# Patient Record
Sex: Male | Born: 1975 | Race: White | Hispanic: No | Marital: Married | Smoking: Never smoker
Health system: Southern US, Community
[De-identification: ages and names within clinical notes are randomized; demographics above are authoritative.]

## PROBLEM LIST (undated history)

## (undated) DIAGNOSIS — E079 Disorder of thyroid, unspecified: Secondary | ICD-10-CM

## (undated) DIAGNOSIS — I10 Essential (primary) hypertension: Secondary | ICD-10-CM

## (undated) DIAGNOSIS — E7800 Pure hypercholesterolemia, unspecified: Secondary | ICD-10-CM

## (undated) HISTORY — PX: OTHER SURGICAL HISTORY: SHX169

---

## 2004-04-22 ENCOUNTER — Ambulatory Visit: Payer: Self-pay | Admitting: Internal Medicine

## 2004-08-25 ENCOUNTER — Ambulatory Visit: Payer: Self-pay | Admitting: Internal Medicine

## 2004-09-07 ENCOUNTER — Ambulatory Visit: Payer: Self-pay | Admitting: Internal Medicine

## 2004-12-07 ENCOUNTER — Ambulatory Visit: Payer: Self-pay | Admitting: Internal Medicine

## 2011-07-17 ENCOUNTER — Encounter (HOSPITAL_BASED_OUTPATIENT_CLINIC_OR_DEPARTMENT_OTHER): Payer: Self-pay | Admitting: *Deleted

## 2011-07-17 ENCOUNTER — Emergency Department (HOSPITAL_BASED_OUTPATIENT_CLINIC_OR_DEPARTMENT_OTHER)
Admission: EM | Admit: 2011-07-17 | Discharge: 2011-07-17 | Disposition: A | Discharge status: Home / Self Care | Payer: Commercial Managed Care - PPO | Attending: Emergency Medicine | Admitting: Emergency Medicine

## 2011-07-17 DIAGNOSIS — I951 Orthostatic hypotension: Secondary | ICD-10-CM | POA: Insufficient documentation

## 2011-07-17 DIAGNOSIS — Z79899 Other long term (current) drug therapy: Secondary | ICD-10-CM | POA: Insufficient documentation

## 2011-07-17 DIAGNOSIS — E7800 Pure hypercholesterolemia, unspecified: Secondary | ICD-10-CM | POA: Insufficient documentation

## 2011-07-17 DIAGNOSIS — J069 Acute upper respiratory infection, unspecified: Secondary | ICD-10-CM | POA: Insufficient documentation

## 2011-07-17 DIAGNOSIS — E079 Disorder of thyroid, unspecified: Secondary | ICD-10-CM | POA: Insufficient documentation

## 2011-07-17 DIAGNOSIS — J45909 Unspecified asthma, uncomplicated: Secondary | ICD-10-CM | POA: Insufficient documentation

## 2011-07-17 HISTORY — DX: Pure hypercholesterolemia, unspecified: E78.00

## 2011-07-17 HISTORY — DX: Disorder of thyroid, unspecified: E07.9

## 2011-07-17 MED ORDER — OXYMETAZOLINE HCL 0.05 % NA SOLN
1.0000 | Freq: Once | NASAL | Status: AC
  Administered 2011-07-17: 1 via NASAL
  Filled 2011-07-17: qty 15

## 2011-07-17 NOTE — ED Provider Notes (Signed)
History     CSN: [id number]  Arrival date & time 07/17/11  5621   First MD Initiated Contact with Patient 07/17/11 [phone]      Chief Complaint  Patient presents with  . URI    (Consider location/radiation/quality/duration/timing/severity/associated sxs/prior treatment) Patient is a 36 y.o. male presenting with URI. The history is provided by the patient.  URI The primary symptoms include ear pain, cough and nausea. Primary symptoms do not include fever, headaches, sore throat or vomiting. The current episode started 6 to 7 days ago. This is a new problem. The problem has not changed since onset. The ear pain began more than 2 days ago. Ear pain is a new problem. Progression since onset: Popping and mild pain in bilateral ears. Both ears are affected. The pain is mild.    The cough began 6 to 7 days ago. The cough is non-productive and dry.  The onset of the illness is associated with exposure to sick contacts (The entire family is sick). Symptoms associated with the illness include plugged ear sensation, congestion and rhinorrhea. The illness is not associated with facial pain. Associated symptoms comments: When he stood up this morning he felt lightheaded and near syncopal which improved when he went outside. The following treatments were addressed: A decongestant was ineffective. NSAIDs were effective.    Past Medical History  Diagnosis Date  . Asthma   . Thyroid disease   . High cholesterol     Past Surgical History  Procedure Date  . Other surgical history     fistula    No family history on file.  History  Substance Use Topics  . Smoking status: Never Smoker   . Smokeless tobacco: Not on file  . Alcohol Use: Yes      Review of Systems  Constitutional: Negative for fever.  HENT: Positive for ear pain, congestion and rhinorrhea. Negative for sore throat.   Respiratory: Positive for cough.   Gastrointestinal: Positive for nausea. Negative for vomiting.    Neurological: Positive for light-headedness. Negative for syncope, weakness and headaches.  All other systems reviewed and are negative.    Allergies  Review of patient's allergies indicates no known allergies.  Home Medications   Current Outpatient Rx  Name Route Sig Dispense Refill  . SYNTHROID PO Oral Take by mouth.    Marland Kitchen NIASPAN PO Oral Take by mouth.      BP 137/76  Pulse 77  Temp(Src) 97.5 F (36.4 C) (Oral)  Resp 19  SpO2 99%  Physical Exam  Nursing note and vitals reviewed. Constitutional: He is oriented to person, place, and time. He appears well-developed and well-nourished. No distress.  HENT:  Head: Normocephalic and atraumatic.  Right Ear: Ear canal normal.  Left Ear: Ear canal normal.  Nose: Mucosal edema and rhinorrhea present. No sinus tenderness. Right sinus exhibits no maxillary sinus tenderness and no frontal sinus tenderness. Left sinus exhibits no maxillary sinus tenderness and no frontal sinus tenderness.  Mouth/Throat: Oropharynx is clear and moist.       Sterile fluid behind bilateral TMs that are bulging  Eyes: Conjunctivae and EOM are normal. Pupils are equal, round, and reactive to light.  Neck: Normal range of motion. Neck supple.  Cardiovascular: Normal rate, regular rhythm and intact distal pulses.   No murmur heard. Pulmonary/Chest: Effort normal and breath sounds normal. No respiratory distress. He has no wheezes. He has no rales.  Musculoskeletal: Normal range of motion. He exhibits no edema and no tenderness.  Neurological: He is alert and oriented to person, place, and time.  Skin: Skin is warm and dry. No rash noted. No erythema.  Psychiatric: He has a normal mood and affect. His behavior is normal.    ED Course  Procedures (including critical care time)  Labs Reviewed - No data to display No results found.   1. URI (upper respiratory infection)   2. Orthostatic hypotension       MDM   Pt with symptoms consistent with  viral URI that has been ongoing for the last 1 week and states today when he stood up from the couch he felt lightheaded and dizzy. There was no syncope he denies any shortness of breath or pain. Because he has a strong family history of cardiac disease and he was looking on the Internet some of the information he found concerned him and he wanted to be checked out.  Well appearing here.  No signs of breathing difficulty  No signs of pharyngitis, otitis or abnormal abdominal findings.  Heart and lung exam is normal. There is no wheezing but he does have nasal congestion with swollen nasal turbinates and bulging TMs bilaterally. Due to no chest pain, shortness of breath or syncope do not feel that an EKG or chest x-ray is warranted at this time. The patient was a little orthostatic this morning after standing up and that's what caused his symptoms. He has been taking Mucinex and not drinking much fluid and feel he is just mildly dehydrated. He will increase his fluid intake and rest for the next few days and use Afrin as needed for congestion.        Gwyneth Sprout, MD 07/17/11 [phone]

## 2011-07-17 NOTE — ED Notes (Signed)
Patient states he has been having cold symptoms all week, cough, sinus congestion, yellow colored drainage, this morning concerned because of feeling dizzy/light-headed/nausea, slept on the couch last night sitting up to assistt him in breathing due to congestion, no chills/vomiting. Per wife concerned due to family Hx of cardiac issues

## 2019-06-15 ENCOUNTER — Other Ambulatory Visit: Payer: Self-pay

## 2019-06-15 ENCOUNTER — Encounter (HOSPITAL_BASED_OUTPATIENT_CLINIC_OR_DEPARTMENT_OTHER): Payer: Self-pay

## 2019-06-15 ENCOUNTER — Emergency Department (HOSPITAL_BASED_OUTPATIENT_CLINIC_OR_DEPARTMENT_OTHER)
Admission: EM | Admit: 2019-06-15 | Discharge: 2019-06-15 | Disposition: A | Discharge status: Home / Self Care | Payer: Managed Care, Other (non HMO) | Attending: Emergency Medicine | Admitting: Emergency Medicine

## 2019-06-15 DIAGNOSIS — R42 Dizziness and giddiness: Secondary | ICD-10-CM

## 2019-06-15 DIAGNOSIS — J45909 Unspecified asthma, uncomplicated: Secondary | ICD-10-CM | POA: Insufficient documentation

## 2019-06-15 DIAGNOSIS — I10 Essential (primary) hypertension: Secondary | ICD-10-CM | POA: Diagnosis not present

## 2019-06-15 HISTORY — DX: Essential (primary) hypertension: I10

## 2019-06-15 MED ORDER — LORAZEPAM 2 MG/ML IJ SOLN
1.0000 mg | Freq: Once | INTRAMUSCULAR | Status: AC
  Administered 2019-06-15: 1 mg via INTRAVENOUS
  Filled 2019-06-15: qty 1

## 2019-06-15 MED ORDER — MECLIZINE HCL 25 MG PO TABS: 25.0000 mg | ORAL_TABLET | Freq: Once | ORAL | Status: DC

## 2019-06-15 MED ORDER — MECLIZINE HCL 25 MG PO TABS
25.0000 mg | ORAL_TABLET | Freq: Once | ORAL | Status: AC
  Administered 2019-06-15: 01:00:00 25 mg via ORAL
  Filled 2019-06-15: qty 1

## 2019-06-15 MED ORDER — ONDANSETRON HCL 4 MG/2ML IJ SOLN: 4.0000 mg | Freq: Once | INTRAMUSCULAR | Status: AC | PRN

## 2019-06-15 MED ORDER — ONDANSETRON HCL 4 MG/2ML IJ SOLN
INTRAMUSCULAR | Status: AC
  Administered 2019-06-15: 01:00:00 4 mg via INTRAVENOUS
  Filled 2019-06-15: qty 2

## 2019-06-15 MED ORDER — SODIUM CHLORIDE 0.9 % IV BOLUS
1000.0000 mL | Freq: Once | INTRAVENOUS | Status: AC
  Administered 2019-06-15: 01:00:00 1000 mL via INTRAVENOUS

## 2019-06-15 MED ORDER — MECLIZINE HCL 25 MG PO TABS: 25.0000 mg | ORAL_TABLET | Freq: Three times a day (TID) | ORAL | 0 refills | Status: AC | PRN

## 2019-06-15 NOTE — ED Notes (Signed)
Pt wife updated at this time with pt plan of care.

## 2019-06-15 NOTE — ED Triage Notes (Signed)
Hx of vertigo, started vomiting 1 hr ago, ear pressure x 2 days and dizziness. Vomited x2 with EMS, actively vomiting during triage.

## 2019-06-15 NOTE — ED Provider Notes (Signed)
[facility] EMERGENCY DEPARTMENT Provider Note   CSN: [id number] Arrival date & time: 06/15/19  0031     History Chief Complaint  Patient presents with  . Dizziness    Joel Nguyen is a 43 y.o. male.  Patient is a 43 year old male with past medical history of hypertension, asthma, and recurrent vertigo.  Patient presents this evening with dizziness and vomiting.  This started acutely and was preceded by a "fullness" in his left ear.  This is how his episodes seem to begin.  He denies any injury or trauma.  He began vomiting uncontrollably and got to the point where he could not move without severe dizziness and vomiting.  EMS was called and he was transported here.  The history is provided by the patient.  Dizziness Quality:  Head spinning Severity:  Severe Onset quality:  Sudden Duration:  1 hour Timing:  Constant Progression:  Unchanged Chronicity:  Recurrent Context: head movement   Relieved by:  Nothing Worsened by:  Nothing Ineffective treatments:  None tried      Past Medical History:  Diagnosis Date  . Asthma   . High cholesterol   . Hypertension   . Thyroid disease     There are no problems to display for this patient.   Past Surgical History:  Procedure Laterality Date  . OTHER SURGICAL HISTORY     fistula       History reviewed. No pertinent family history.  Social History   Tobacco Use  . Smoking status: Never Smoker  . Smokeless tobacco: Never Used  Substance Use Topics  . Alcohol use: Yes  . Drug use: No    Home Medications Prior to Admission medications   Medication Sig Start Date End Date Taking? Authorizing Provider  Levothyroxine Sodium (SYNTHROID PO) Take by mouth.    [provider]  Niacin, Antihyperlipidemic, (NIASPAN PO) Take by mouth.    [provider]    Allergies    Vicodin [hydrocodone-acetaminophen]  Review of Systems   Review of Systems  Neurological: Positive for dizziness.  All  other systems reviewed and are negative.   Physical Exam Updated Vital Signs Ht 6' (1.829 m)   Wt 99.8 kg   BMI 29.84 kg/m   Physical Exam Vitals and nursing note reviewed.  Constitutional:      General: He is not in acute distress.    Appearance: He is well-developed. He is not diaphoretic.  HENT:     Head: Normocephalic and atraumatic.     Right Ear: Tympanic membrane normal.     Left Ear: Tympanic membrane normal.  Cardiovascular:     Rate and Rhythm: Normal rate and regular rhythm.     Heart sounds: No murmur. No friction rub.  Pulmonary:     Effort: Pulmonary effort is normal. No respiratory distress.     Breath sounds: Normal breath sounds. No wheezing or rales.  Abdominal:     General: Bowel sounds are normal. There is no distension.     Palpations: Abdomen is soft.     Tenderness: There is no abdominal tenderness.  Musculoskeletal:        General: Normal range of motion.     Cervical back: Normal range of motion and neck supple.  Skin:    General: Skin is warm and dry.  Neurological:     Mental Status: He is alert and oriented to person, place, and time.     Cranial Nerves: No cranial nerve deficit.  Motor: No weakness.     Coordination: Coordination normal.     Comments: Positive Hallpike.     ED Results / Procedures / Treatments   Labs (all labs ordered are listed, but only abnormal results are displayed) Labs Reviewed - No data to display  EKG None  Radiology No results found.  Procedures Procedures (including critical care time)  Medications Ordered in ED Medications  meclizine (ANTIVERT) tablet 25 mg (has no administration in time range)  sodium chloride 0.9 % bolus 1,000 mL (has no administration in time range)  ondansetron (ZOFRAN) injection 4 mg (4 mg Intravenous Given 06/15/19 0040)    ED Course  I have reviewed the triage vital signs and the nursing notes.  Pertinent labs & imaging results that were available during my care of  the patient were reviewed by me and considered in my medical decision making (see chart for details).    MDM Rules/Calculators/A&P  Patient is a 43 year old male presenting with complaints of fullness in his left ear followed by dizziness, nausea, and vomiting. Patient has history of vertigo and this feels similar. Patient was given Zofran by EMS, then an additional dose here along with IV fluids, meclizine, and Ativan. He is feeling significantly improved and believes he can go home.  As patient has been experiencing these episodes for the past 2 years and this is consistent with prior episodes, I do not feel the need for extensive work-up. He is to follow-up with an ENT.  Final Clinical Impression(s) / ED Diagnoses Final diagnoses:  None    Rx / DC Orders ED Discharge Orders    None       Geoffery Lyons, MD 06/15/19 [phone]

## 2019-06-15 NOTE — ED Notes (Signed)
Pt ambulated in hallway- reports feeling dizziness and nausea. Pt assisted back to bed. edp informed.

## 2019-06-15 NOTE — ED Notes (Signed)
Pt reports feeling better and wants to go home

## 2019-06-15 NOTE — Discharge Instructions (Signed)
Begin taking meclizine as prescribed.  Follow-up with ENT if symptoms not improving in the next few days, and return to the ER if symptoms significantly worsen or change.

## 2020-03-25 ENCOUNTER — Encounter (HOSPITAL_BASED_OUTPATIENT_CLINIC_OR_DEPARTMENT_OTHER): Payer: Self-pay | Admitting: Emergency Medicine

## 2020-03-25 ENCOUNTER — Emergency Department (HOSPITAL_BASED_OUTPATIENT_CLINIC_OR_DEPARTMENT_OTHER): Payer: BC Managed Care – PPO

## 2020-03-25 ENCOUNTER — Other Ambulatory Visit: Payer: Self-pay

## 2020-03-25 ENCOUNTER — Emergency Department (HOSPITAL_BASED_OUTPATIENT_CLINIC_OR_DEPARTMENT_OTHER)
Admission: EM | Admit: 2020-03-25 | Discharge: 2020-03-25 | Disposition: A | Discharge status: Home / Self Care | Payer: BC Managed Care – PPO | Attending: Emergency Medicine | Admitting: Emergency Medicine

## 2020-03-25 DIAGNOSIS — J45909 Unspecified asthma, uncomplicated: Secondary | ICD-10-CM | POA: Insufficient documentation

## 2020-03-25 DIAGNOSIS — R0602 Shortness of breath: Secondary | ICD-10-CM | POA: Diagnosis present

## 2020-03-25 DIAGNOSIS — E039 Hypothyroidism, unspecified: Secondary | ICD-10-CM | POA: Diagnosis not present

## 2020-03-25 DIAGNOSIS — U071 COVID-19: Secondary | ICD-10-CM

## 2020-03-25 DIAGNOSIS — I10 Essential (primary) hypertension: Secondary | ICD-10-CM | POA: Diagnosis not present

## 2020-03-25 LAB — CBC WITH DIFFERENTIAL/PLATELET
Abs Immature Granulocytes: 0.05 10*3/uL (ref 0.00–0.07)
Basophils Absolute: 0 10*3/uL (ref 0.0–0.1)
Basophils Relative: 0 %
Eosinophils Absolute: 0 10*3/uL (ref 0.0–0.5)
Eosinophils Relative: 0 %
HCT: 45.4 % (ref 39.0–52.0)
Hemoglobin: 14.6 g/dL (ref 13.0–17.0)
Immature Granulocytes: 1 %
Lymphocytes Relative: 17 %
Lymphs Abs: 1.6 10*3/uL (ref 0.7–4.0)
MCH: 27.2 pg (ref 26.0–34.0)
MCHC: 32.2 g/dL (ref 30.0–36.0)
MCV: 84.7 fL (ref 80.0–100.0)
Monocytes Absolute: 0.8 10*3/uL (ref 0.1–1.0)
Monocytes Relative: 8 %
Neutro Abs: 7.2 10*3/uL (ref 1.7–7.7)
Neutrophils Relative %: 74 %
Platelets: 370 10*3/uL (ref 150–400)
RBC: 5.36 MIL/uL (ref 4.22–5.81)
RDW: 13.3 % (ref 11.5–15.5)
WBC: 9.6 10*3/uL (ref 4.0–10.5)
nRBC: 0 % (ref 0.0–0.2)

## 2020-03-25 LAB — BASIC METABOLIC PANEL
Anion gap: 12 (ref 5–15)
BUN: 24 mg/dL — ABNORMAL HIGH (ref 6–20)
CO2: 27 mmol/L (ref 22–32)
Calcium: 8.8 mg/dL — ABNORMAL LOW (ref 8.9–10.3)
Chloride: 99 mmol/L (ref 98–111)
Creatinine, Ser: 0.95 mg/dL (ref 0.61–1.24)
GFR calc non Af Amer: >60 mL/min (ref >60)
Glucose, Bld: 112 mg/dL — ABNORMAL HIGH (ref 70–99)
Potassium: 3.5 mmol/L (ref 3.5–5.1)
Sodium: 138 mmol/L (ref 135–145)

## 2020-03-25 LAB — D-DIMER, QUANTITATIVE: D-Dimer, Quant: 0.81 ug/mL-FEU — ABNORMAL HIGH (ref 0.00–0.50)

## 2020-03-25 LAB — TROPONIN I (HIGH SENSITIVITY)
Troponin I (High Sensitivity): 9 ng/L (ref <18)
Troponin I (High Sensitivity): 9 ng/L (ref <18)

## 2020-03-25 MED ORDER — IOHEXOL 350 MG/ML SOLN
100.0000 mL | Freq: Once | INTRAVENOUS | Status: AC | PRN
  Administered 2020-03-25: 100 mL via INTRAVENOUS

## 2020-03-25 NOTE — Discharge Instructions (Addendum)
You have COVID-19.  Your CT scan does not show signs of a PE.  It does show you have Covid.  There is also a 3 cm hypodense right thyroid nodule recommend continue following up with your PCP.    Since you are Covid positive you must self quarantine for 10 days since symptom onset.  I recommend over-the-counter pain medication like ibuprofen or Tylenol as needed for pain, please follow dosing on the back of bottle.  Please stay hydrated and if you do not have an appetite recommend soups as this will provide you with fluid as well as calories.  If you have additional questions regarding Covid please call post Covid care for further recommendations and management of your Covid symptoms.  Come back to emergency department if develop severe chest pain, shortness of breath, severe abdominal pain, uncontrolled nausea, vomiting, diarrhea.

## 2020-03-25 NOTE — ED Triage Notes (Signed)
Pt here for increased SOB today, he tested positive on PCR on Tuesday. Pt had video visit today for increase SOB and sent here today to rule out blood clot and is requesting CT per PCP. Pt in no distress in triage, o2 95% on room air.

## 2020-03-25 NOTE — ED Provider Notes (Signed)
[facility] EMERGENCY DEPARTMENT Provider Note   CSN: [id number] Arrival date & time: 03/25/20  1317     History Chief Complaint  Patient presents with  . Cough  . Shortness of Breath    Joel Nguyen is a 44 y.o. male.  HPI    Patient with significant medical history of asthma, hypertension presents to the emergency department with chief complaint of increased shortness of breath after being diagnosed with Covid on 09/29 started on prednisone and Zithromax.  Patient saw his PCP today, he noted that the patient was having difficulty breathing and encouraged him to come to emergency department for additional work-up as well as a PE rule out.  Patient states his symptoms started on Tuesday and he had productive cough with general body aches as well as decrease in appetite.  He denies nasal congestion, ear pain, chest pain, abdominal pain, nausea, vomiting, diarrhea.  He states he is not currently Covid vaccinated but is scheduled to go to his mono body infusion tomorrow.  He came here today just to be assessed to make sure he does not have a PE.  Patient denies headache, fever, chills, sore throat, chest pain, abdominal pain, nausea vomiting diarrhea, pedal edema.  Past Medical History:  Diagnosis Date  . Asthma   . High cholesterol   . Hypertension   . Thyroid disease     There are no problems to display for this patient.   Past Surgical History:  Procedure Laterality Date  . OTHER SURGICAL HISTORY     fistula       No family history on file.  Social History   Tobacco Use  . Smoking status: Never Smoker  . Smokeless tobacco: Never Used  Substance Use Topics  . Alcohol use: Yes    Comment: occasionally  . Drug use: No    Home Medications Prior to Admission medications   Medication Sig Start Date End Date Taking? Authorizing Provider  Levothyroxine Sodium (SYNTHROID PO) Take by mouth.    [provider]  meclizine (ANTIVERT) 25 MG tablet Take 1  tablet (25 mg total) by mouth 3 (three) times daily as needed for dizziness. 06/15/19   Geoffery Lyons, MD  Niacin, Antihyperlipidemic, (NIASPAN PO) Take by mouth.    [provider]    Allergies    Vicodin [hydrocodone-acetaminophen]  Review of Systems   Review of Systems  Constitutional: Negative for chills and fever.  HENT: Negative for congestion, tinnitus, trouble swallowing and voice change.   Eyes: Negative for visual disturbance.  Respiratory: Positive for cough and shortness of breath.   Cardiovascular: Negative for chest pain.  Gastrointestinal: Negative for abdominal pain, diarrhea, nausea, rectal pain and vomiting.  Genitourinary: Negative for enuresis, flank pain and frequency.  Musculoskeletal: Negative for back pain and gait problem.  Skin: Negative for rash.  Neurological: Negative for dizziness, facial asymmetry and headaches.  Hematological: Does not bruise/bleed easily.    Physical Exam Updated Vital Signs BP 130/69 (BP Location: Right Arm)   Pulse 69   Temp 98.8 F (37.1 C)   Resp 14   Ht 6' (1.829 m)   Wt 102.1 kg   SpO2 95%   BMI 30.52 kg/m   Physical Exam Vitals and nursing note reviewed.  Constitutional:      General: He is not in acute distress.    Appearance: He is not ill-appearing.  HENT:     Head: Normocephalic and atraumatic.     Right Ear: Tympanic membrane,  ear canal and external ear normal.     Left Ear: Tympanic membrane, ear canal and external ear normal.     Nose: No congestion.     Mouth/Throat:     Mouth: Mucous membranes are moist.     Pharynx: Oropharynx is clear. No oropharyngeal exudate or posterior oropharyngeal erythema.  Eyes:     General: No scleral icterus. Cardiovascular:     Rate and Rhythm: Normal rate and regular rhythm.     Pulses: Normal pulses.     Heart sounds: No murmur heard.  No friction rub. No gallop.   Pulmonary:     Effort: No respiratory distress.     Breath sounds: No wheezing, rhonchi or  rales.  Abdominal:     General: There is no distension.     Palpations: Abdomen is soft.     Tenderness: There is no abdominal tenderness. There is no right CVA tenderness, left CVA tenderness or guarding.  Musculoskeletal:        General: No swelling.     Right lower leg: No edema.     Left lower leg: No edema.  Skin:    General: Skin is warm and dry.     Findings: No rash.  Neurological:     Mental Status: He is alert.  Psychiatric:        Mood and Affect: Mood normal.     ED Results / Procedures / Treatments   Labs (all labs ordered are listed, but only abnormal results are displayed) Labs Reviewed  BASIC METABOLIC PANEL - Abnormal; Notable for the following components:      Result Value   Glucose, Bld 112 (*)    BUN 24 (*)    Calcium 8.8 (*)    All other components within normal limits  D-DIMER, QUANTITATIVE (NOT AT [facility]) - Abnormal; Notable for the following components:   D-Dimer, Quant 0.81 (*)    All other components within normal limits  CBC WITH DIFFERENTIAL/PLATELET  TROPONIN I (HIGH SENSITIVITY)  TROPONIN I (HIGH SENSITIVITY)    EKG None  Radiology CT Angio Chest PE W and/or Wo Contrast  Result Date: 03/25/2020 CLINICAL DATA:  increased SOB today, he tested positive on PCR on Tuesday. Pt had video visit today for increase SOB and sent here today to rule out blood clot and is requesting CT per PCP EXAM: CT ANGIOGRAPHY CHEST WITH CONTRAST TECHNIQUE: Multidetector CT imaging of the chest was performed using the standard protocol during bolus administration of intravenous contrast. Multiplanar CT image reconstructions and MIPs were obtained to evaluate the vascular anatomy. CONTRAST:  OMNIPAQUE IOHEXOL 350 MG/ML SOLN COMPARISON:  Chest x-ray 05/02/2008. FINDINGS: Cardiovascular: Satisfactory opacification of the pulmonary arteries to the segmental level. No evidence of pulmonary embolism. The main pulmonary artery is normal in caliber. Normal heart size. No  pericardial effusion. The thoracic aorta is normal in caliber. No atherosclerotic plaque. No coronary artery calcifications. Mediastinum/Nodes: Multiple mediastinal lymph nodes are enlarged with as an example a 1.8 cm right paratracheal lymph node (6:104, a 2.1 and 1.7 cm prevascular lymph node that are consolidative open (6:111). Couple of pericentimeter enlarged hilar lymph nodes are noted. No axillary lymph nodes. Suggestion of an approximately 3 cm hypodense nodule within the right thyroid gland. Associated subjacent calcification., the trachea and esophagus demonstrate no significant findings. Lungs/Pleura: Diffuse ground-glass and peribronchovascular and peripheral airspace opacities with subpleural sparing in some regions. Findings are more prominent within the perihilar regions. No pulmonary nodule or mass within  the aerated lungs. No pleural effusion. No pneumothorax. Upper Abdomen: No acute abnormality. Musculoskeletal: No chest wall abnormality. No acute or significant osseous findings. Review of the MIP images confirms the above findings. IMPRESSION: 1. No pulmonary embolus. 2. Diffuse multifocal ground-glass airspace opacities that are most prominent in the perihilar regions. Findings could represent infection/inflammation (COVID 19 not excluded) versus alveolar hemorrhage. Associated reactive mediastinal and hilar lymphadenopathy. 3. A 3 cm hypodense right thyroid gland nodule. Recommend non-emergent thyroid ultrasound for further evaluation. These results were called by telephone at the time of interpretation on 03/25/2020 at 4:00 pm to provider Dr. Vanetta MuldersScott Zackowski , who verbally acknowledged these results. Electronically Signed   By: Tish FredericksonMorgane  Naveau M.D.   On: 03/25/2020 16:00   DG Chest Portable 1 View  Result Date: 03/25/2020 CLINICAL DATA:  Shortness of breath EXAM: PORTABLE CHEST 1 VIEW COMPARISON:  May 02, 2008 FINDINGS: There is patchy airspace opacity throughout each upper lobe and mid  lung region. No consolidation. Heart is upper normal in size with pulmonary vascularity normal. No adenopathy. There is mild leftward deviation of the trachea at the cervical-thoracic junction. IMPRESSION: 1. Ill-defined airspace opacity bilaterally, likely due to atypical organism pneumonia. Check of COVID-19 status advised. No consolidation. 2.  Heart upper normal in size. 3. Focal leftward deviation of the trachea at the cervical-thoracic junction. Question focal thyroid enlargement in this region. Electronically Signed   By: Bretta BangWilliam  Woodruff III M.D.   On: 03/25/2020 14:15    Procedures Procedures (including critical care time)  Medications Ordered in ED Medications  iohexol (OMNIPAQUE) 350 MG/ML injection 100 mL (100 mLs Intravenous Contrast Given 03/25/20 1541)    ED Course  I have reviewed the triage vital signs and the nursing notes.  Pertinent labs & imaging results that were available during my care of the patient were reviewed by me and considered in my medical decision making (see chart for details).    MDM Rules/Calculators/A&P                          Patient presents after being Covid positive since the 29th, complains of productive cough some shortness of breath.  He was alert, did not appear in acute distress, vital signs reassuring.  Will order screening labs, D-dimer, troponins, chest x-ray for further evaluation.  CBC negative for leukocytosis or anemia, BMP does not show electrolyte abnormalities, no metabolic acidosis, hyperglycemia of 112, no AKI, no anion gap.  D-dimer was 0.81 will order CT angio for PE rule out.   had negative delta troponin.  Chest x-ray shows ill defined opacity likely atypical Covid pneumonia.  CT chest angiogram showed diffuse multifocal ground glass airspace opacities most likely Covid pneumonia.  As well as a 3 cm hypodense right thyroid nodule.  Recommend nonemergent thyroid ultrasound further evaluation.  EKG was sinus without signs of ischemia  no ST elevation or depression noted.  I have low suspicion for ACS as patient denies chest pain, he has no cardiac history, negative delta troponin, EKG without signs of ischemia.  Low suspicion for PE as patient CT angio does not show acute findings of PE.  Low suspicion for DVT as patient denies leg pain, leg swelling, no pedal edema noted on exam.  Low suspicion for systemic infection as patient is nontoxic-appearing, vital signs reassuring, no obvious source of infection noted on exam.  Low suspicion patient not be hospitalized due to Covid as he has no new oxygen requirements,  no signs of respiratory distress noted on exam, lung sounds are clear bilaterally.  I suspect patient suffering from Covid, will recommend over-the-counter pain medications, and following up with his infusion treatment scheduled for tomorrow.  Also informed patient of the hypodensity right thyroid gland nodule we will have him follow-up with PCP for further evaluation.  Patient's vital signs remained stable, no indication for hospital admission.  Patient was given at home care and strict return precautions.  Patient verbalized understood and agreed to plan. Final Clinical Impression(s) / ED Diagnoses Final diagnoses:  COVID-19 virus infection    Rx / DC Orders ED Discharge Orders    None       Carroll Sage, PA-C 03/25/20 1703    Vanetta Mulders, MD 03/28/20 [phone]

## 2020-03-25 NOTE — ED Notes (Signed)
Patient transported to CT 

## 2020-03-28 ENCOUNTER — Telehealth: Payer: Self-pay | Admitting: Family Medicine

## 2020-03-28 NOTE — Telephone Encounter (Signed)
Pt was called per referral from [facility]

## 2020-05-01 ENCOUNTER — Emergency Department (HOSPITAL_BASED_OUTPATIENT_CLINIC_OR_DEPARTMENT_OTHER)
Admission: EM | Admit: 2020-05-01 | Discharge: 2020-05-01 | Disposition: A | Discharge status: Home / Self Care | Payer: BC Managed Care – PPO | Attending: Emergency Medicine | Admitting: Emergency Medicine

## 2020-05-01 ENCOUNTER — Encounter (HOSPITAL_BASED_OUTPATIENT_CLINIC_OR_DEPARTMENT_OTHER): Payer: Self-pay | Admitting: *Deleted

## 2020-05-01 ENCOUNTER — Emergency Department (HOSPITAL_BASED_OUTPATIENT_CLINIC_OR_DEPARTMENT_OTHER): Payer: BC Managed Care – PPO

## 2020-05-01 ENCOUNTER — Other Ambulatory Visit: Payer: Self-pay

## 2020-05-01 DIAGNOSIS — I10 Essential (primary) hypertension: Secondary | ICD-10-CM | POA: Insufficient documentation

## 2020-05-01 DIAGNOSIS — W270XXA Contact with workbench tool, initial encounter: Secondary | ICD-10-CM | POA: Diagnosis not present

## 2020-05-01 DIAGNOSIS — S61012A Laceration without foreign body of left thumb without damage to nail, initial encounter: Secondary | ICD-10-CM | POA: Diagnosis not present

## 2020-05-01 DIAGNOSIS — J45909 Unspecified asthma, uncomplicated: Secondary | ICD-10-CM | POA: Insufficient documentation

## 2020-05-01 DIAGNOSIS — Z23 Encounter for immunization: Secondary | ICD-10-CM | POA: Insufficient documentation

## 2020-05-01 MED ORDER — LIDOCAINE HCL (PF) 1 % IJ SOLN
5.0000 mL | Freq: Once | INTRAMUSCULAR | Status: AC
  Administered 2020-05-01: 5 mL
  Filled 2020-05-01: qty 5

## 2020-05-01 MED ORDER — TETANUS-DIPHTH-ACELL PERTUSSIS 5-2.5-18.5 LF-MCG/0.5 IM SUSY
0.5000 mL | PREFILLED_SYRINGE | Freq: Once | INTRAMUSCULAR | Status: AC
Start: 2020-05-01 — End: 2020-05-01
  Administered 2020-05-01: 0.5 mL via INTRAMUSCULAR
  Filled 2020-05-01: qty 0.5

## 2020-05-01 NOTE — ED Provider Notes (Signed)
[facility] EMERGENCY DEPARTMENT Provider Note   CSN: [id number] Arrival date & time: 05/01/20  1958     History Chief Complaint  Patient presents with  . Laceration    Joel Nguyen is a 44 y.o. male.  HPI   Patient with no significant medical history presents to the emergency department with chief complaint of laceration to his MP joint of the first digit of his left hand.  He states he was using a circular saw when it kicked back and hit his left hand.  Patient states he was able to control the bleeding with direct pressure.  He states he is able to flex and extend his thumb without difficulty, denies paresthesias or weakness in his thumb.  He denies losing conscious, is not on anticoagulant.  He endorses that the last time he had his tetanus shot was over 5 years ago, he is not immunocompromise.  Patient states he has a dull sensation at his thumb denies any alleviating or infection at this time.  Patient denies headaches, fevers, chills, shortness of breath, chest pain, dumping, nausea, vomiting, diarrhea, pedal edema.  Past Medical History:  Diagnosis Date  . Asthma   . High cholesterol   . Hypertension   . Thyroid disease     There are no problems to display for this patient.   Past Surgical History:  Procedure Laterality Date  . OTHER SURGICAL HISTORY     fistula       No family history on file.  Social History   Tobacco Use  . Smoking status: Never Smoker  . Smokeless tobacco: Never Used  Substance Use Topics  . Alcohol use: Yes    Comment: occasionally  . Drug use: No    Home Medications Prior to Admission medications   Medication Sig Start Date End Date Taking? Authorizing Provider  Levothyroxine Sodium (SYNTHROID PO) Take by mouth.   Yes [provider]  meclizine (ANTIVERT) 25 MG tablet Take 1 tablet (25 mg total) by mouth 3 (three) times daily as needed for dizziness. 06/15/19   Geoffery Lyons, MD  Niacin, Antihyperlipidemic,  (NIASPAN PO) Take by mouth.    [provider]    Allergies    Vicodin [hydrocodone-acetaminophen]  Review of Systems   Review of Systems  Constitutional: Negative for chills and fever.  HENT: Negative for congestion.   Respiratory: Negative for shortness of breath.   Cardiovascular: Negative for chest pain.  Gastrointestinal: Negative for abdominal pain.  Genitourinary: Negative for enuresis.  Musculoskeletal: Negative for back pain.  Skin: Positive for wound. Negative for rash.       Laceration to the left MCP joint of the first digit  Neurological: Negative for dizziness.  Hematological: Does not bruise/bleed easily.    Physical Exam Updated Vital Signs BP 140/77 (BP Location: Right Arm)   Pulse 72   Temp 98.6 F (37 C) (Oral)   Resp 14   Ht 6' (1.829 m)   Wt 102.5 kg   SpO2 98%   BMI 30.65 kg/m   Physical Exam Vitals and nursing note reviewed.  Constitutional:      General: He is not in acute distress.    Appearance: Normal appearance. He is not ill-appearing.  HENT:     Head: Normocephalic and atraumatic.     Nose: No congestion.  Eyes:     Conjunctiva/sclera: Conjunctivae normal.  Pulmonary:     Effort: Pulmonary effort is normal.     Breath sounds: Normal breath  sounds.  Musculoskeletal:        General: Tenderness and signs of injury present. No swelling or deformity.     Cervical back: Neck supple.     Comments: Patient's left hand was visualized he had full range of motion at the distal, proximal joint of his left thumb.  Neurovascular is fully intact.  Skin:    General: Skin is warm and dry.     Coloration: Skin is not jaundiced or pale.     Findings: Lesion present.     Comments: Patient had a noted laceration measuring approximately 4 cm in length, 1 cm in depth at his left MCP joint of the first digit hemodynamically stable, no ligament or tendon damage noted  Neurological:     Mental Status: He is alert and oriented to person, place, and  time.  Psychiatric:        Mood and Affect: Mood normal.     ED Results / Procedures / Treatments   Labs (all labs ordered are listed, but only abnormal results are displayed) Labs Reviewed - No data to display  EKG None  Radiology DG Hand Complete Left  Result Date: 05/01/2020 CLINICAL DATA:  Laceration EXAM: LEFT HAND - COMPLETE 3+ VIEW COMPARISON:  None. FINDINGS: There is no evidence of fracture or dislocation. There is no evidence of arthropathy or other focal bone abnormality. Soft tissues are unremarkable. IMPRESSION: Negative. Electronically Signed   By: Jasmine Pang M.D.   On: 05/01/2020 22:43    Procedures .Marland KitchenLaceration Repair  Date/Time: 05/01/2020 11:48 PM Performed by: Carroll Sage, PA-C Authorized by: Carroll Sage, PA-C   Consent:    Consent obtained:  Verbal   Consent given by:  Patient   Risks discussed:  Infection, pain, retained foreign body, need for additional repair, poor cosmetic result, tendon damage, vascular damage, poor wound healing and nerve damage   Alternatives discussed:  No treatment Anesthesia (see MAR for exact dosages):    Anesthesia method:  Local infiltration   Local anesthetic:  Lidocaine 1% w/o epi Laceration details:    Location:  Finger   Finger location:  L thumb   Length (cm):  4   Depth (mm):  1 Repair type:    Repair type:  Simple Pre-procedure details:    Preparation:  Patient was prepped and draped in usual sterile fashion and imaging obtained to evaluate for foreign bodies Exploration:    Wound exploration: wound explored through full range of motion and entire depth of wound probed and visualized     Contaminated: no   Treatment:    Area cleansed with:  Saline   Amount of cleaning:  Standard   Irrigation solution:  Sterile saline   Irrigation method:  Syringe   Visualized foreign bodies/material removed: no   Skin repair:    Repair method:  Sutures   Suture size:  4-0   Suture technique:  Simple  interrupted   Number of sutures:  3 Approximation:    Approximation:  Loose Post-procedure details:    Dressing:  Bulky dressing   Patient tolerance of procedure:  Tolerated well, no immediate complications Comments:     After the procedure patient motor, sensation, strength were all intact in patient's left thumb.  Area was soft to the touch with good capillary refill.  No signs of infection were noted, no rash, no ligament or tendon damage present.   (including critical care time)  Medications Ordered in ED Medications  lidocaine (PF) (XYLOCAINE) 1 %  injection 5 mL (5 mLs Infiltration Given by Other 05/01/20 2232)  Tdap (BOOSTRIX) injection 0.5 mL (0.5 mLs Intramuscular Given 05/01/20 2229)    ED Course  I have reviewed the triage vital signs and the nursing notes.  Pertinent labs & imaging results that were available during my care of the patient were reviewed by me and considered in my medical decision making (see chart for details).    MDM Rules/Calculators/A&P                          Patient presents with laceration to his left thumb.  He is alert, does not appear in acute distress, vital signs reassuring.  Will order imaging of his left hand provide him with a tetanus shot.  Will recommend suturing to decrease infection risk and to assist with the healing process.  Patient was agreeable with this and tolerated the procedure well.  He received 3 sutures.  Neurovascular was fully intact after the procedure.  Low suspicion for fracture or dislocation as x-ray does not reveal any acute findings. low suspicion for ligament or tendon damage as area was palpated no gross defects noted, he had full range of motion in his left thumb.  Low suspicion for compartment syndrome as area was palpated it was soft to the touch, neurovascular fully intact before and after the procedure.  Patient received 3 sutures, will provide basic wound care, follow-up and 7 to 10 days for suture  removal.  Vital signs have remained stable, no indication for hospital admission. Patient given at home care as well strict return precautions.  Patient verbalized that they understood agreed to said plan.   Final Clinical Impression(s) / ED Diagnoses Final diagnoses:  Laceration of left thumb without foreign body without damage to nail, initial encounter    Rx / DC Orders ED Discharge Orders    None       Carroll Sage, PA-C 05/01/20 2353    Melene Plan, DO 05/05/20 4401

## 2020-05-01 NOTE — Discharge Instructions (Addendum)
Seen here for thumb laceration.  Provide you with 3 sutures.  Please abstain from getting the wound wet for the first 24 hours.  After that I would like you to rinse the wound with clean water and change the dressings twice a day for the next 7 to 10 days.  You may take over-the-counter pain medications like ibuprofen or Tylenol every 6 as needed please follow dosing the back of bottle.  It is important a follow-up at your PCP, urgent care, this department in the next 7 to 10 days to have sutures removed.  Vital signs have remained stable, no indication for hospital admission.  Patient discussed with attending and they agreed with assessment and plan.  Patient given at home care as well strict return precautions.  Patient verbalized that they understood agreed to said plan.

## 2020-05-01 NOTE — ED Triage Notes (Signed)
Laceration to his left thumb with a saw.

## 2021-10-06 IMAGING — CT CT ANGIO CHEST
2 of 8 series · 18 of 36 positions shown · IV contrast (Omnipaque)
Comparison: Chest x-ray 05/02/2008.

CLINICAL DATA: increased SOB today, he tested positive on PCR on
[REDACTED]. Pt had video visit today for increase SOB and sent here
today to rule out blood clot and is requesting CT per PCP

EXAM:
CT ANGIOGRAPHY CHEST WITH CONTRAST
TECHNIQUE: Multidetector CT imaging of the chest was performed using the
standard protocol during bolus administration of intravenous
contrast. Multiplanar CT image reconstructions and MIPs were
obtained to evaluate the vascular anatomy.
CONTRAST:  100mL OMNIPAQUE IOHEXOL 350 MG/ML SOLN

[Series 6: pe thins · axial · 0.74mm/px · z∈[-333,-43]mm · 17 of 326 slices shown]
[im 18/326  lung]
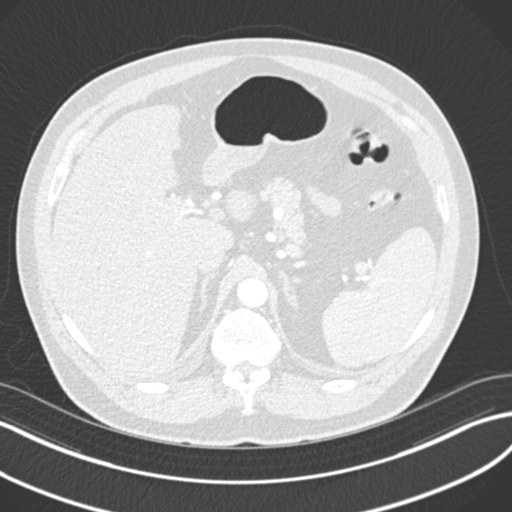
[im 35/326  mediastinal]
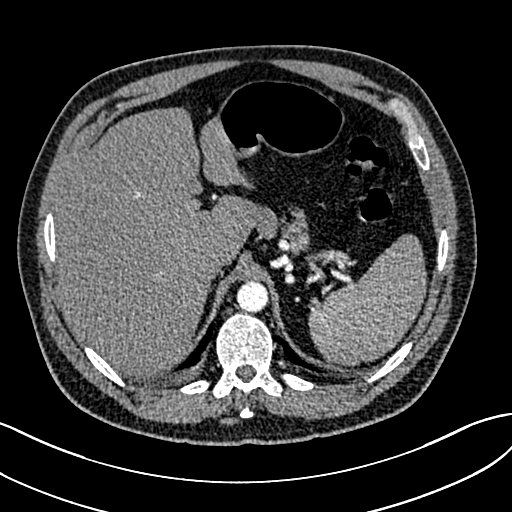
[im 52/326  lung]
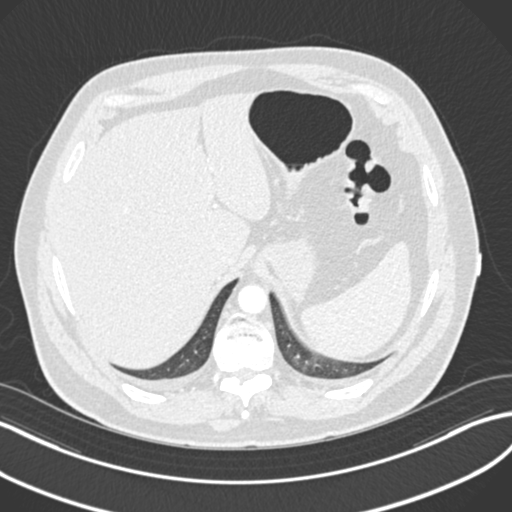
[im 69/326  mediastinal]
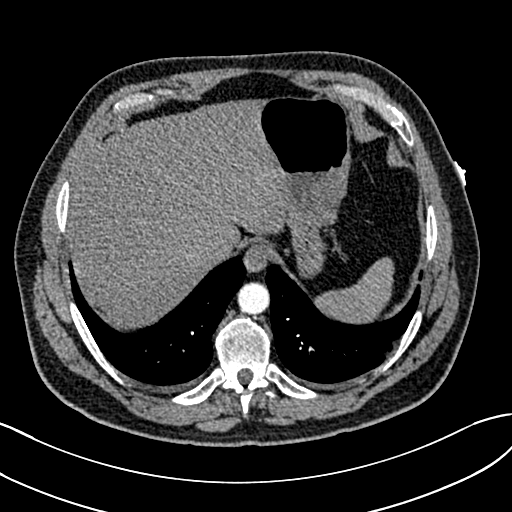
[im 86/326  lung]
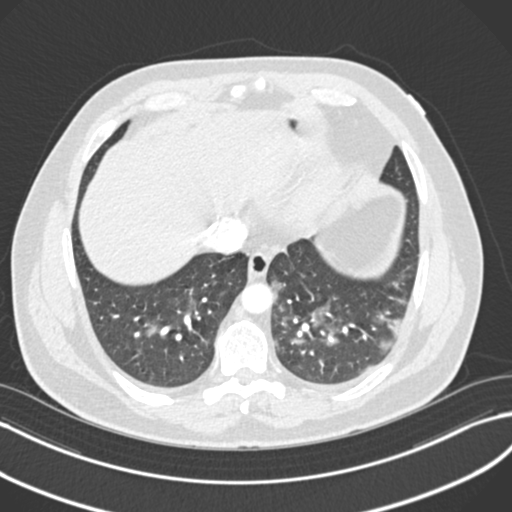
[im 103/326  mediastinal]
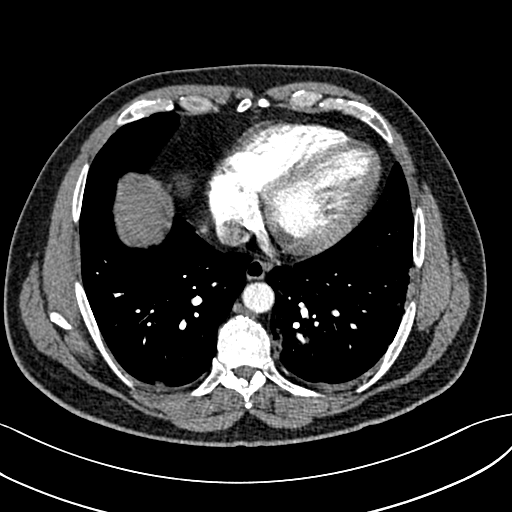
[im 120/326  lung]
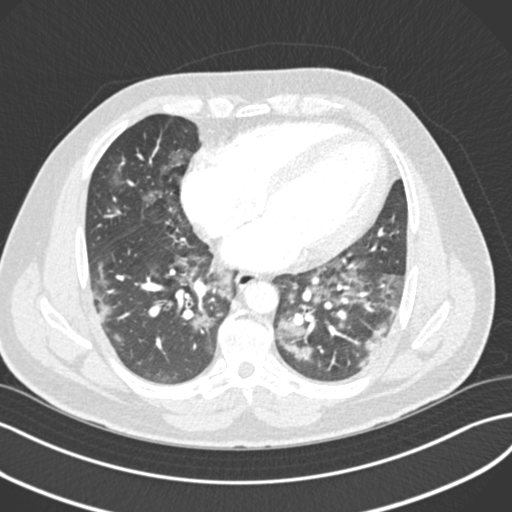
[im 137/326  mediastinal]
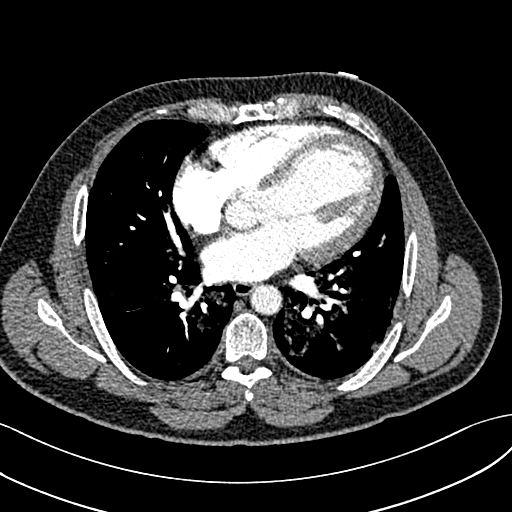
[im 172/326  lung]
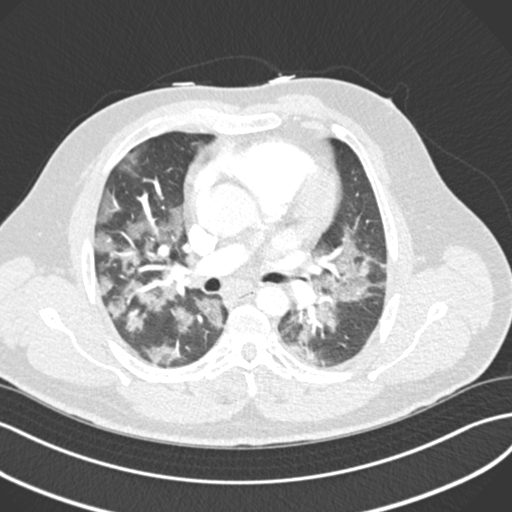
[im 189/326  mediastinal]
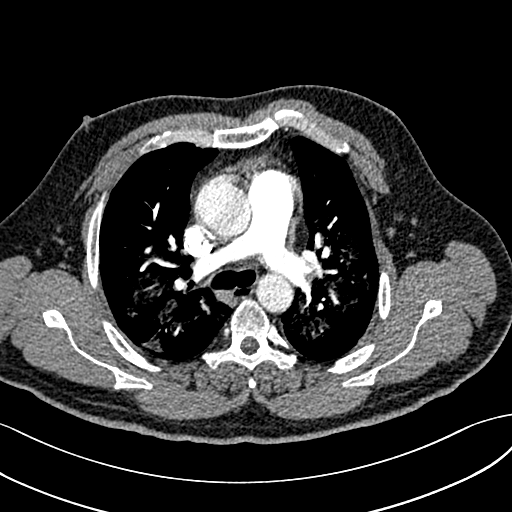
[im 206/326  lung]
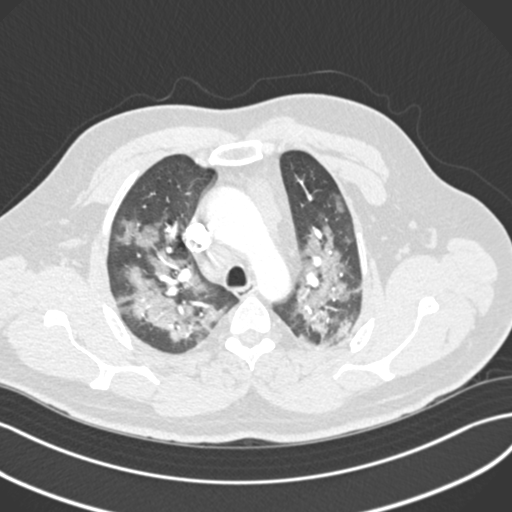
[im 223/326  mediastinal]
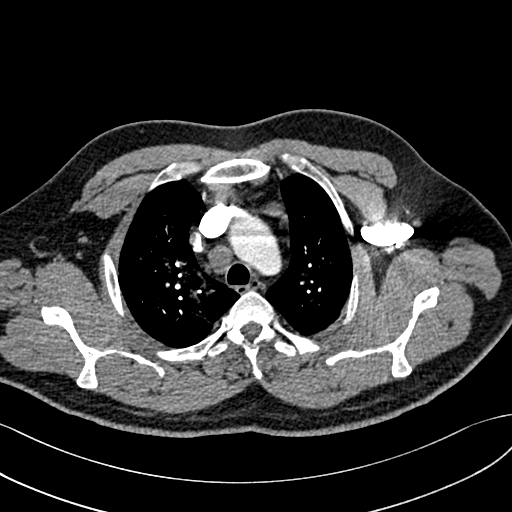
[im 240/326  lung]
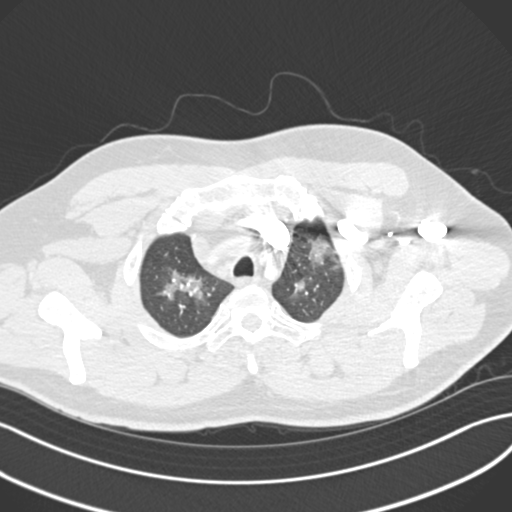
[im 257/326  mediastinal]
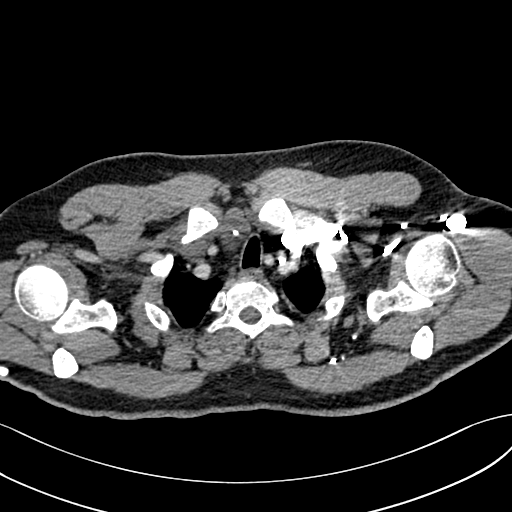
[im 274/326  lung]
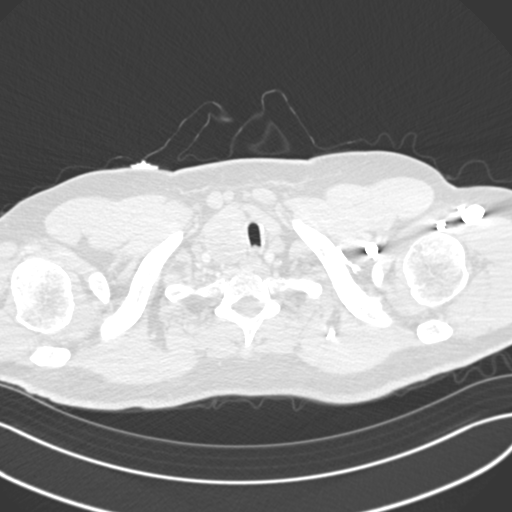
[im 291/326  mediastinal]
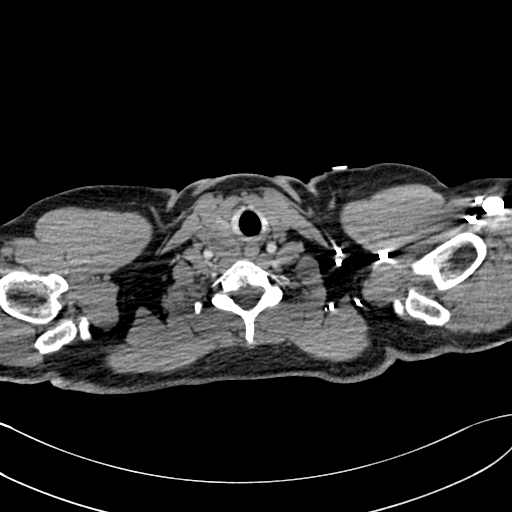
[im 308/326  lung]
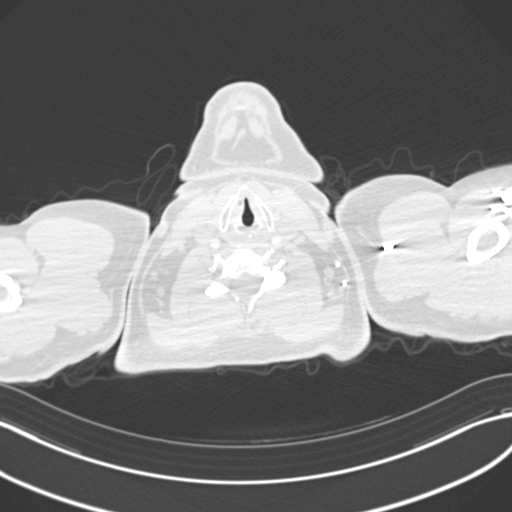

[Series 7: pe coronal mpr · coronal · 0.64mm/px · 1 of 161 slices shown]
[im 81/161  mediastinal]
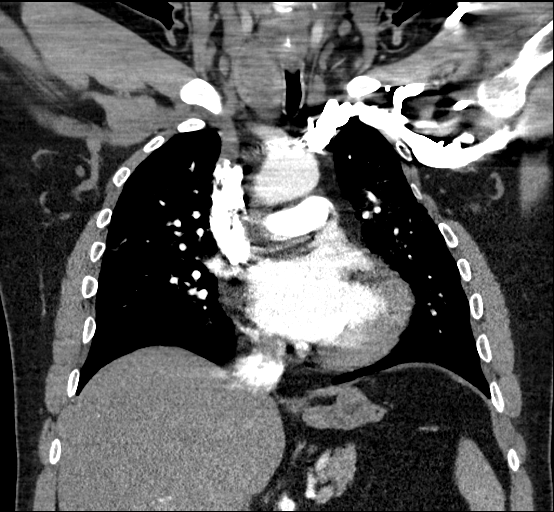

[18 of 36 positions shown; findings below may reference images not displayed]

FINDINGS: Cardiovascular: Satisfactory opacification of the pulmonary arteries
to the segmental level. No evidence of pulmonary embolism. The main
pulmonary artery is normal in caliber. Normal heart size. No
pericardial effusion. The thoracic aorta is normal in caliber. No
atherosclerotic plaque. No coronary artery calcifications.

Mediastinum/Nodes: Multiple mediastinal lymph nodes are enlarged
with as an example a 1.8 cm right paratracheal lymph node ([DATE], a
2.1 and 1.7 cm prevascular lymph node that are consolidative open
([DATE]). Couple of pericentimeter enlarged hilar lymph nodes are
noted. No axillary lymph nodes. Suggestion of an approximately 3 cm
hypodense nodule within the right thyroid gland. Associated
subjacent calcification., the trachea and esophagus demonstrate no
significant findings.

Lungs/Pleura: Diffuse ground-glass and peribronchovascular and
peripheral airspace opacities with subpleural sparing in some
regions. Findings are more prominent within the perihilar regions.
No pulmonary nodule or mass within the aerated lungs. No pleural
effusion. No pneumothorax.

Upper Abdomen: No acute abnormality.

Musculoskeletal: No chest wall abnormality. No acute or significant
osseous findings.

Review of the MIP images confirms the above findings.
IMPRESSION: 1. No pulmonary embolus.
2. Diffuse multifocal ground-glass airspace opacities that are most
prominent in the perihilar regions. Findings could represent
infection/inflammation (COVID 19 not excluded) versus alveolar
hemorrhage. Associated reactive mediastinal and hilar
lymphadenopathy.
3. A 3 cm hypodense right thyroid gland nodule. Recommend
non-emergent thyroid ultrasound for further evaluation.

These results were called by telephone at the time of interpretation
on 03/25/2020 at [DATE] to provider Dr. Danii Aujla , who
verbally acknowledged these results.

## 2023-10-12 DIAGNOSIS — H9042 Sensorineural hearing loss, unilateral, left ear, with unrestricted hearing on the contralateral side: Secondary | ICD-10-CM | POA: Diagnosis not present

## 2023-10-12 DIAGNOSIS — H93A9 Pulsatile tinnitus, unspecified ear: Secondary | ICD-10-CM | POA: Diagnosis not present

## 2023-10-12 DIAGNOSIS — H6123 Impacted cerumen, bilateral: Secondary | ICD-10-CM | POA: Diagnosis not present

## 2023-10-14 DIAGNOSIS — H6990 Unspecified Eustachian tube disorder, unspecified ear: Secondary | ICD-10-CM | POA: Diagnosis not present

## 2023-10-14 DIAGNOSIS — H93A2 Pulsatile tinnitus, left ear: Secondary | ICD-10-CM | POA: Diagnosis not present

## 2023-10-27 DIAGNOSIS — H93A2 Pulsatile tinnitus, left ear: Secondary | ICD-10-CM | POA: Diagnosis not present

## 2023-10-27 DIAGNOSIS — J3489 Other specified disorders of nose and nasal sinuses: Secondary | ICD-10-CM | POA: Diagnosis not present

## 2023-10-27 DIAGNOSIS — H8102 Meniere's disease, left ear: Secondary | ICD-10-CM | POA: Diagnosis not present

## 2023-10-27 DIAGNOSIS — R040 Epistaxis: Secondary | ICD-10-CM | POA: Diagnosis not present

## 2023-11-11 DIAGNOSIS — F902 Attention-deficit hyperactivity disorder, combined type: Secondary | ICD-10-CM | POA: Diagnosis not present

## 2023-11-11 DIAGNOSIS — F411 Generalized anxiety disorder: Secondary | ICD-10-CM | POA: Diagnosis not present

## 2023-12-09 DIAGNOSIS — F411 Generalized anxiety disorder: Secondary | ICD-10-CM | POA: Diagnosis not present

## 2023-12-09 DIAGNOSIS — F902 Attention-deficit hyperactivity disorder, combined type: Secondary | ICD-10-CM | POA: Diagnosis not present

## 2024-01-05 DIAGNOSIS — F902 Attention-deficit hyperactivity disorder, combined type: Secondary | ICD-10-CM | POA: Diagnosis not present

## 2024-01-05 DIAGNOSIS — F411 Generalized anxiety disorder: Secondary | ICD-10-CM | POA: Diagnosis not present
